# Patient Record
Sex: Male | Born: 2000 | Hispanic: No | Marital: Single | Smoking: Never smoker
Health system: Southern US, Community
[De-identification: ages and names within clinical notes are randomized; demographics above are authoritative.]

## PROBLEM LIST (undated history)

## (undated) DIAGNOSIS — M1990 Unspecified osteoarthritis, unspecified site: Secondary | ICD-10-CM

## (undated) HISTORY — PX: TYMPANOSTOMY TUBE PLACEMENT: SHX32

---
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
## 2021-03-22 ENCOUNTER — Other Ambulatory Visit: Payer: Self-pay

## 2021-03-22 ENCOUNTER — Ambulatory Visit
Admission: RE | Admit: 2021-03-22 | Discharge: 2021-03-22 | Disposition: A | Discharge status: Home / Self Care | Payer: BC Managed Care – PPO | Source: Ambulatory Visit | Attending: Family Medicine | Admitting: Family Medicine

## 2021-03-22 VITALS — BP 122/79 | HR 69 | Temp 98.7°F | Resp 18 | Ht 66.0 in | Wt 150.0 lb

## 2021-03-22 DIAGNOSIS — J029 Acute pharyngitis, unspecified: Secondary | ICD-10-CM | POA: Diagnosis not present

## 2021-03-22 LAB — GROUP A STREP BY PCR: Group A Strep by PCR: NOT DETECTED

## 2021-03-22 MED ORDER — PREDNISONE 10 MG (21) PO TBPK: ORAL_TABLET | ORAL | 0 refills | Status: AC

## 2021-03-22 NOTE — ED Triage Notes (Signed)
Pt c/o cough for several weeks, started as productive but now is dry. Pt thinks he may have had COVID at that time, but did test negative. Pt also reports swollen tonsil on the left side for several days. Pt denies f/n/v/d or other symptoms.

## 2021-03-22 NOTE — Discharge Instructions (Addendum)
Medication as directed. ° °Take care ° °Dr. Reis Goga  °

## 2021-03-23 NOTE — ED Provider Notes (Signed)
[facility]    CSN: [id number] Arrival date & time: 03/22/21  1353      History   Chief Complaint Chief Complaint  Patient presents with   Cough   Sore Throat    HPI  20 year old male presents with the above complaints. Patient reports ongoing cough for the past several weeks.  Initially was productive and now is dry.  He states that it seems to be improving but is still somewhat troublesome.  He was exposed to COVID-19 but tested negative at home.  Patient states that approximately 3 days ago he developed sore throat.  This is his primary complaint today.  Pain 8/10 in severity.  He reports a swollen tonsils.  No fever.  No relieving factors.  No other associated symptoms.  No other complaints.  Past Medical History:  Diagnosis Date   Arthritis    Past Surgical History:  Procedure Laterality Date   TYMPANOSTOMY TUBE PLACEMENT      Home Medications    Prior to Admission medications   Medication Sig Start Date End Date Taking? Authorizing Provider  predniSONE (STERAPRED UNI-PAK 21 TAB) 10 MG (21) TBPK tablet 6 tablets on day 1; decrease by 1 tablet daily until gone. 03/22/21  Yes Tommie Sams, DO    Family History Family History  Problem Relation Age of Onset   Irregular heart beat Mother     Social History Social History   Tobacco Use   Smoking status: Never   Smokeless tobacco: Never  Vaping Use   Vaping Use: Never used  Substance Use Topics   Alcohol use: No   Drug use: Yes    Types: Marijuana     Allergies   Patient has no known allergies.   Review of Systems Review of Systems  Constitutional:  Negative for fever.  HENT:  Positive for sore throat.   Respiratory:  Positive for cough.     Physical Exam Triage Vital Signs ED Triage Vitals  Enc Vitals Group     BP 03/22/21 1405 122/79     Pulse Rate 03/22/21 1405 69     Resp 03/22/21 1405 18     Temp 03/22/21 1405 98.7 F (37.1 C)     Temp Source 03/22/21 1405 Oral     SpO2  03/22/21 1405 100 %     Weight 03/22/21 1403 150 lb (68 kg)     Height 03/22/21 1403 5\' 6"  (1.676 m)     Head Circumference --      Peak Flow --      Pain Score 03/22/21 1403 8     Pain Loc --      Pain Edu? --      Excl. in GC? --    Updated Vital Signs BP 122/79 (BP Location: Left Arm)   Pulse 69   Temp 98.7 F (37.1 C) (Oral)   Resp 18   Ht 5\' 6"  (1.676 m)   Wt 68 kg   SpO2 100%   BMI 24.21 kg/m   Visual Acuity Right Eye Distance:   Left Eye Distance:   Bilateral Distance:    Right Eye Near:   Left Eye Near:    Bilateral Near:     Physical Exam Vitals and nursing note reviewed.  Constitutional:      General: He is not in acute distress.    Appearance: He is well-developed. He is not ill-appearing.  HENT:     Head: Normocephalic and atraumatic.  Right Ear: Tympanic membrane normal.     Left Ear: Tympanic membrane normal.     Mouth/Throat:     Pharynx: Uvula midline. Posterior oropharyngeal erythema present.     Tonsils: No tonsillar exudate. 1+ on the right. 1+ on the left.  Cardiovascular:     Rate and Rhythm: Normal rate and regular rhythm.     Heart sounds: No murmur heard. Pulmonary:     Effort: Pulmonary effort is normal.     Breath sounds: Normal breath sounds. No wheezing, rhonchi or rales.  Neurological:     Mental Status: He is alert.     UC Treatments / Results  Labs (all labs ordered are listed, but only abnormal results are displayed) Labs Reviewed  GROUP A STREP BY PCR    EKG   Radiology No results found.  Procedures Procedures (including critical care time)  Medications Ordered in UC Medications - No data to display  Initial Impression / Assessment and Plan / UC Course  I have reviewed the triage vital signs and the nursing notes.  Pertinent labs & imaging results that were available during my care of the patient were reviewed by me and considered in my medical decision making (see chart for details).    20 year old  male presents with cough and sore throat.  Lungs clear.  Strep testing negative.  Treating for viral pharyngitis with prednisone.  Supportive care.  Final Clinical Impressions(s) / UC Diagnoses   Final diagnoses:  Viral pharyngitis     Discharge Instructions      Medication as directed.  Take care  Dr. Adriana Simas    ED Prescriptions     Medication Sig Dispense Auth. Provider   predniSONE (STERAPRED UNI-PAK 21 TAB) 10 MG (21) TBPK tablet 6 tablets on day 1; decrease by 1 tablet daily until gone. 21 tablet Everlene Other G, DO      PDMP not reviewed this encounter.   Tommie Sams, [place] 03/23/21 [phone]
